# Patient Record
Sex: Male | Born: 1985 | Race: White | Hispanic: No | Marital: Married | Smoking: Never smoker
Health system: Southern US, Community
[De-identification: ages and names within clinical notes are randomized; demographics above are authoritative.]

## PROBLEM LIST (undated history)

## (undated) DIAGNOSIS — G43909 Migraine, unspecified, not intractable, without status migrainosus: Secondary | ICD-10-CM

## (undated) DIAGNOSIS — Z8701 Personal history of pneumonia (recurrent): Secondary | ICD-10-CM

## (undated) DIAGNOSIS — T7840XA Allergy, unspecified, initial encounter: Secondary | ICD-10-CM

## (undated) DIAGNOSIS — R161 Splenomegaly, not elsewhere classified: Secondary | ICD-10-CM

## (undated) DIAGNOSIS — R55 Syncope and collapse: Secondary | ICD-10-CM

## (undated) DIAGNOSIS — K760 Fatty (change of) liver, not elsewhere classified: Secondary | ICD-10-CM

## (undated) HISTORY — DX: Syncope and collapse: R55

## (undated) HISTORY — DX: Personal history of pneumonia (recurrent): Z87.01

## (undated) HISTORY — DX: Fatty (change of) liver, not elsewhere classified: K76.0

## (undated) HISTORY — DX: Splenomegaly, not elsewhere classified: R16.1

## (undated) HISTORY — DX: Migraine, unspecified, not intractable, without status migrainosus: G43.909

## (undated) HISTORY — DX: Allergy, unspecified, initial encounter: T78.40XA

---
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
## 2009-10-21 ENCOUNTER — Emergency Department (HOSPITAL_BASED_OUTPATIENT_CLINIC_OR_DEPARTMENT_OTHER): Admission: EM | Admit: 2009-10-21 | Discharge: 2009-10-21 | Payer: Self-pay | Admitting: Emergency Medicine

## 2010-09-05 LAB — BASIC METABOLIC PANEL
BUN: 19 mg/dL (ref 6–23)
CO2: 29 mEq/L (ref 19–32)
Calcium: 8.8 mg/dL (ref 8.4–10.5)
Chloride: 108 mEq/L (ref 96–112)
Creatinine, Ser: 1 mg/dL (ref 0.4–1.5)
GFR calc Af Amer: >60 mL/min (ref >60)
GFR calc non Af Amer: >60 mL/min (ref >60)
Glucose, Bld: 117 mg/dL — ABNORMAL HIGH (ref 70–99)
Potassium: 4 mEq/L (ref 3.5–5.1)
Sodium: 145 mEq/L (ref 135–145)

## 2010-09-05 LAB — CBC
HCT: 44.5 % (ref 39.0–52.0)
Hemoglobin: 15 g/dL (ref 13.0–17.0)
MCHC: 33.8 g/dL (ref 30.0–36.0)
RBC: 5.21 MIL/uL (ref 4.22–5.81)
RDW: 13.3 % (ref 11.5–15.5)

## 2010-09-05 LAB — DIFFERENTIAL
Basophils Absolute: 0.1 10*3/uL (ref 0.0–0.1)
Eosinophils Relative: 7 % — ABNORMAL HIGH (ref 0–5)
Lymphocytes Relative: 30 % (ref 12–46)
Monocytes Absolute: 0.3 10*3/uL (ref 0.1–1.0)
Monocytes Relative: 4 % (ref 3–12)

## 2010-09-05 LAB — POCT CARDIAC MARKERS: Troponin i, poc: <0.05 ng/mL (ref 0.00–0.09)

## 2011-03-10 ENCOUNTER — Encounter: Payer: Self-pay | Admitting: Emergency Medicine

## 2011-03-10 ENCOUNTER — Emergency Department (HOSPITAL_BASED_OUTPATIENT_CLINIC_OR_DEPARTMENT_OTHER)
Admission: EM | Admit: 2011-03-10 | Discharge: 2011-03-10 | Disposition: A | Discharge status: Home / Self Care | Payer: Worker's Compensation | Attending: Emergency Medicine | Admitting: Emergency Medicine

## 2011-03-10 ENCOUNTER — Emergency Department (INDEPENDENT_AMBULATORY_CARE_PROVIDER_SITE_OTHER): Payer: Worker's Compensation

## 2011-03-10 DIAGNOSIS — S97109A Crushing injury of unspecified toe(s), initial encounter: Secondary | ICD-10-CM

## 2011-03-10 DIAGNOSIS — S99929A Unspecified injury of unspecified foot, initial encounter: Secondary | ICD-10-CM

## 2011-03-10 DIAGNOSIS — IMO0002 Reserved for concepts with insufficient information to code with codable children: Secondary | ICD-10-CM | POA: Insufficient documentation

## 2011-03-10 DIAGNOSIS — X58XXXA Exposure to other specified factors, initial encounter: Secondary | ICD-10-CM

## 2011-03-10 DIAGNOSIS — Y9289 Other specified places as the place of occurrence of the external cause: Secondary | ICD-10-CM | POA: Insufficient documentation

## 2011-03-10 DIAGNOSIS — S8990XA Unspecified injury of unspecified lower leg, initial encounter: Secondary | ICD-10-CM | POA: Insufficient documentation

## 2011-03-10 MED ORDER — NAPROXEN 500 MG PO TABS: 500.0000 mg | ORAL_TABLET | Freq: Two times a day (BID) | ORAL | Status: DC

## 2011-03-10 NOTE — ED Provider Notes (Signed)
History     CSN: [id number] Arrival date & time: 03/10/2011 10:52 AM  Chief Complaint  Patient presents with  . Toe Injury    HPI  (Consider location/radiation/quality/duration/timing/severity/associated sxs/prior treatment)  The history is provided by the patient.  HAD CART JACK RUN OVER LEFT TOE YESTERDAY AT WORK SOME BLEEDING FROM NAIL AREA BUT RESOLVED. NO OTHER INJURY.   History reviewed. No pertinent past medical history.  History reviewed. No pertinent past surgical history.  History reviewed. No pertinent family history.  History  Substance Use Topics  . Smoking status: Never Smoker   . Smokeless tobacco: Not on file  . Alcohol Use: Yes     occastional      Review of Systems  Review of Systems  Constitutional: Negative for fever.  HENT: Negative for neck pain.   Respiratory: Negative for shortness of breath.   Cardiovascular: Negative for chest pain.  Gastrointestinal: Negative for abdominal pain.  Musculoskeletal: Negative for back pain.  Skin: Positive for wound. Negative for rash.  Neurological: Negative for headaches.    Allergies  Review of patient's allergies indicates no known allergies.  Home Medications   Current Outpatient Rx  Name Route Sig Dispense Refill  . NAPROXEN 500 MG PO TABS Oral Take 1 tablet (500 mg total) by mouth 2 (two) times daily. 30 tablet 0    Physical Exam    BP 135/75  Pulse 63  Temp(Src) 98.2 F (36.8 C) (Oral)  Resp 16  Ht 6\' 4"  (1.93 m)  Wt 240 lb (108.863 kg)  BMI 29.21 kg/m2  SpO2 100%  Physical Exam  Nursing note and vitals reviewed. Constitutional: He is oriented to person, place, and time. He appears well-developed and well-nourished. No distress.  HENT:  Head: Normocephalic and atraumatic.  Mouth/Throat: Oropharynx is clear and moist.  Eyes: Pupils are equal, round, and reactive to light.  Neck: Normal range of motion. Neck supple.  Cardiovascular: Normal rate, regular rhythm and normal heart  sounds.   Pulmonary/Chest: Effort normal and breath sounds normal.  Abdominal: Soft. Bowel sounds are normal.  Musculoskeletal: Normal range of motion. He exhibits tenderness.       NL EX FOR LEFT TOE WITH CONTUSION AND SOME SUB UNGAL HEMATOMA. NO ACITVE BLEEDING   Neurological: He is alert and oriented to person, place, and time. No cranial nerve deficit. He exhibits normal muscle tone. Coordination normal.  Skin: Skin is warm. No rash noted.    ED Course  Procedures (including critical care time)  Labs Reviewed - No data to display Dg Toe Great Left  03/10/2011  *RADIOLOGY REPORT*  Clinical Data: Crash injury yesterday.  Bruising and swelling.  LEFT TOE - 2+ VIEW  Comparison: None.  Findings: No acute fracture or dislocation.  Mild soft tissue swelling.  IMPRESSION: Soft tissue swelling, without acute osseous abnormality.  Original Report Authenticated By: Consuello Bossier, M.D.     1. Toe injury      MDM NO FRACTURE   IMP TOE INJURY.         Shelda Jakes, MD 03/10/11 1254

## 2011-03-10 NOTE — ED Notes (Signed)
Left big toe injury occurred at work.  Pt ran over with pallet jack.  Noted bruising and swelling to left toe.

## 2011-06-19 DIAGNOSIS — R161 Splenomegaly, not elsewhere classified: Secondary | ICD-10-CM

## 2011-06-19 DIAGNOSIS — K760 Fatty (change of) liver, not elsewhere classified: Secondary | ICD-10-CM

## 2011-06-19 HISTORY — DX: Splenomegaly, not elsewhere classified: R16.1

## 2011-06-19 HISTORY — DX: Fatty (change of) liver, not elsewhere classified: K76.0

## 2011-12-05 ENCOUNTER — Other Ambulatory Visit: Payer: Self-pay | Admitting: Internal Medicine

## 2011-12-10 ENCOUNTER — Other Ambulatory Visit: Payer: Self-pay

## 2011-12-11 ENCOUNTER — Ambulatory Visit
Admission: RE | Admit: 2011-12-11 | Discharge: 2011-12-11 | Disposition: A | Discharge status: Home / Self Care | Payer: Managed Care, Other (non HMO) | Source: Ambulatory Visit | Attending: Internal Medicine | Admitting: Internal Medicine

## 2011-12-13 ENCOUNTER — Encounter: Payer: Self-pay | Admitting: Internal Medicine

## 2012-01-04 ENCOUNTER — Encounter: Payer: Self-pay | Admitting: *Deleted

## 2012-01-07 ENCOUNTER — Encounter: Payer: Self-pay | Admitting: Internal Medicine

## 2012-01-07 ENCOUNTER — Other Ambulatory Visit (INDEPENDENT_AMBULATORY_CARE_PROVIDER_SITE_OTHER): Payer: Managed Care, Other (non HMO)

## 2012-01-07 ENCOUNTER — Ambulatory Visit (INDEPENDENT_AMBULATORY_CARE_PROVIDER_SITE_OTHER): Payer: Managed Care, Other (non HMO) | Admitting: Internal Medicine

## 2012-01-07 VITALS — BP 102/66 | HR 72 | Ht 76.0 in | Wt 245.2 lb

## 2012-01-07 DIAGNOSIS — R748 Abnormal levels of other serum enzymes: Secondary | ICD-10-CM

## 2012-01-07 LAB — HEPATIC FUNCTION PANEL
Alkaline Phosphatase: 58 U/L (ref 39–117)
Bilirubin, Direct: 0.1 mg/dL (ref 0.0–0.3)
Total Bilirubin: 0.8 mg/dL (ref 0.3–1.2)
Total Protein: 6.9 g/dL (ref 6.0–8.3)

## 2012-01-07 NOTE — Patient Instructions (Addendum)
Please go to the basement for blood tests (CPK, hepatic function panel) that will be used to try to understand the abnormal blood tests (transaminases). We will call the results and plans. I have also given you information about fatty liver, which this might be due to.  Thank you for choosing me and  Gastroenterology.  Iva Boop, MD, [facility]

## 2012-01-07 NOTE — Progress Notes (Signed)
Quick Note:  LFT's and CPK normal  Abnormalities could have been from muscles  Recheck LFT's in 3 months  Ok to exercise if desired - no more supplements ______

## 2012-01-07 NOTE — Progress Notes (Signed)
Subjective:    Patient ID: Mitchell Wilkinson, male    DOB: [birth year], 26 y.o.   MRN: [id number] Referred by Quentin Mulling, PA-C HPI This 26 yo wm presents for evaluation of abnormal transaminases. He was found to have transaminases about 2x abnormal on recent physical labs. No prior history of liver test abnormalities. He had started exercising with free weights and at the gym at that time He was also using a "testosterone booster". He thinks he had some muscle aches but is not sure. No history of blood transfusions, family history of liver disease or alcohol use. GI ROS is otherwise negative.   No Known Allergies Outpatient Prescriptions Prior to Visit  Medication Sig Dispense Refill  . naproxen (NAPROSYN) 500 MG tablet Take 1 tablet (500 mg total) by mouth 2 (two) times daily.  30 tablet  0   Past Medical History  Diagnosis Date  . Splenomegaly 2013  . Fatty liver 2013  . Syncope     stress induced  . Migraines   . History of pneumonia    History reviewed. No pertinent past surgical history. History   Social History  . Marital Status: Married    Spouse Name: N/A    Number of Children: 0  .     Occupational History  .  [employer]   Social History Main Topics  . Smoking status: Never Smoker   . Smokeless tobacco: Never Used  . Alcohol Use: Yes     ocass  . Drug Use: No    Family History  Problem Relation Age of Onset  . Hypertension Father   . Diabetes Mother   . Colon polyps Mother   . Diabetes Maternal Grandfather   . Diabetes Maternal Grandmother   . Heart disease Maternal Grandfather   . Heart disease Paternal Grandfather     Review of Systems + allergies and sinus problems, back pain, headaches All other ROS negative or as per HPI    Objective:   Physical Exam     Physical Exam: General:  Well-developed, well-nourished and in no acute distress Eyes:  anicteric. ENT:   Mouth and posterior pharynx free of lesions.  Neck:   supple w/o thyromegaly or  mass.  Lungs: Clear to auscultation bilaterally. Heart:  S1S2, no rubs, murmurs, gallops. Abdomen:  soft, non-tender, no hepatosplenomegaly, hernia, or mass and BS+.  Lymph:  no cervical or supraclavicular adenopathy. Extremities:   no edema Skin   no rash. Neuro:  A&O x 3.  Psych:  appropriate mood and  Affect.   Data Reviewed: US Abdomen - 12/11/2011 mild fatty infiltration of liver and mild splenomegaly 11/21/10 AST 39 and ALT 39 11/28/2011 AST 80 and ALT 92 with otherwise normal LFT's and CBC LDL 116 but lipids o/w normal TSH normal Hep A IgM antibody negative Hep B Surace Ag and Hep B core Ab IgM negative HCV antibody negative  Ceruloplasmin 25 Anti-smooth mm Ab negative and AMA negative      Assessment & Plan:   1. Abnormal transaminases  CK (Creatine Kinase), Hepatic function panel   Cause not clear but could be from skeletal  muscle given hx of exercsing and testosterone booster. Check LFT's and CPK. If ok then repeat LFT's in 3 months.  If transaminases still elevated and CPK normal, probably is a fatty liver issue. Check ferritin and ANA and alpha-1 antitrypsin labs to be sure, if this pattern of tests abnormality appears.  I appreciate the opportunity to care for this patient.  CC: Quentin Mulling, PA-C  [place]:  Lab Results  Component Value Date   CKTOTAL 117 01/07/2012   Lab Results  Component Value Date   ALT 39 01/07/2012   AST 25 01/07/2012   ALKPHOS 58 01/07/2012   BILITOT 0.8 01/07/2012   All normal. Will notify patient and recheck LFT's in 3 months.

## 2012-01-08 ENCOUNTER — Other Ambulatory Visit: Payer: Self-pay | Admitting: *Deleted

## 2012-01-08 DIAGNOSIS — R945 Abnormal results of liver function studies: Secondary | ICD-10-CM

## 2012-04-03 ENCOUNTER — Telehealth: Payer: Self-pay

## 2012-04-03 NOTE — Telephone Encounter (Signed)
Spoke with the patient and he is advised he is due for repeat LFTs.  He will come one day this week or next

## 2013-04-06 IMAGING — CR DG TOE GREAT 2+V*L*
3 series · 3 of 3 positions shown · non-contrast
Comparison: None.

CLINICAL DATA: Crash injury yesterday.  Bruising and swelling.

LEFT TOE - 2+ VIEW

[t toes ap left]
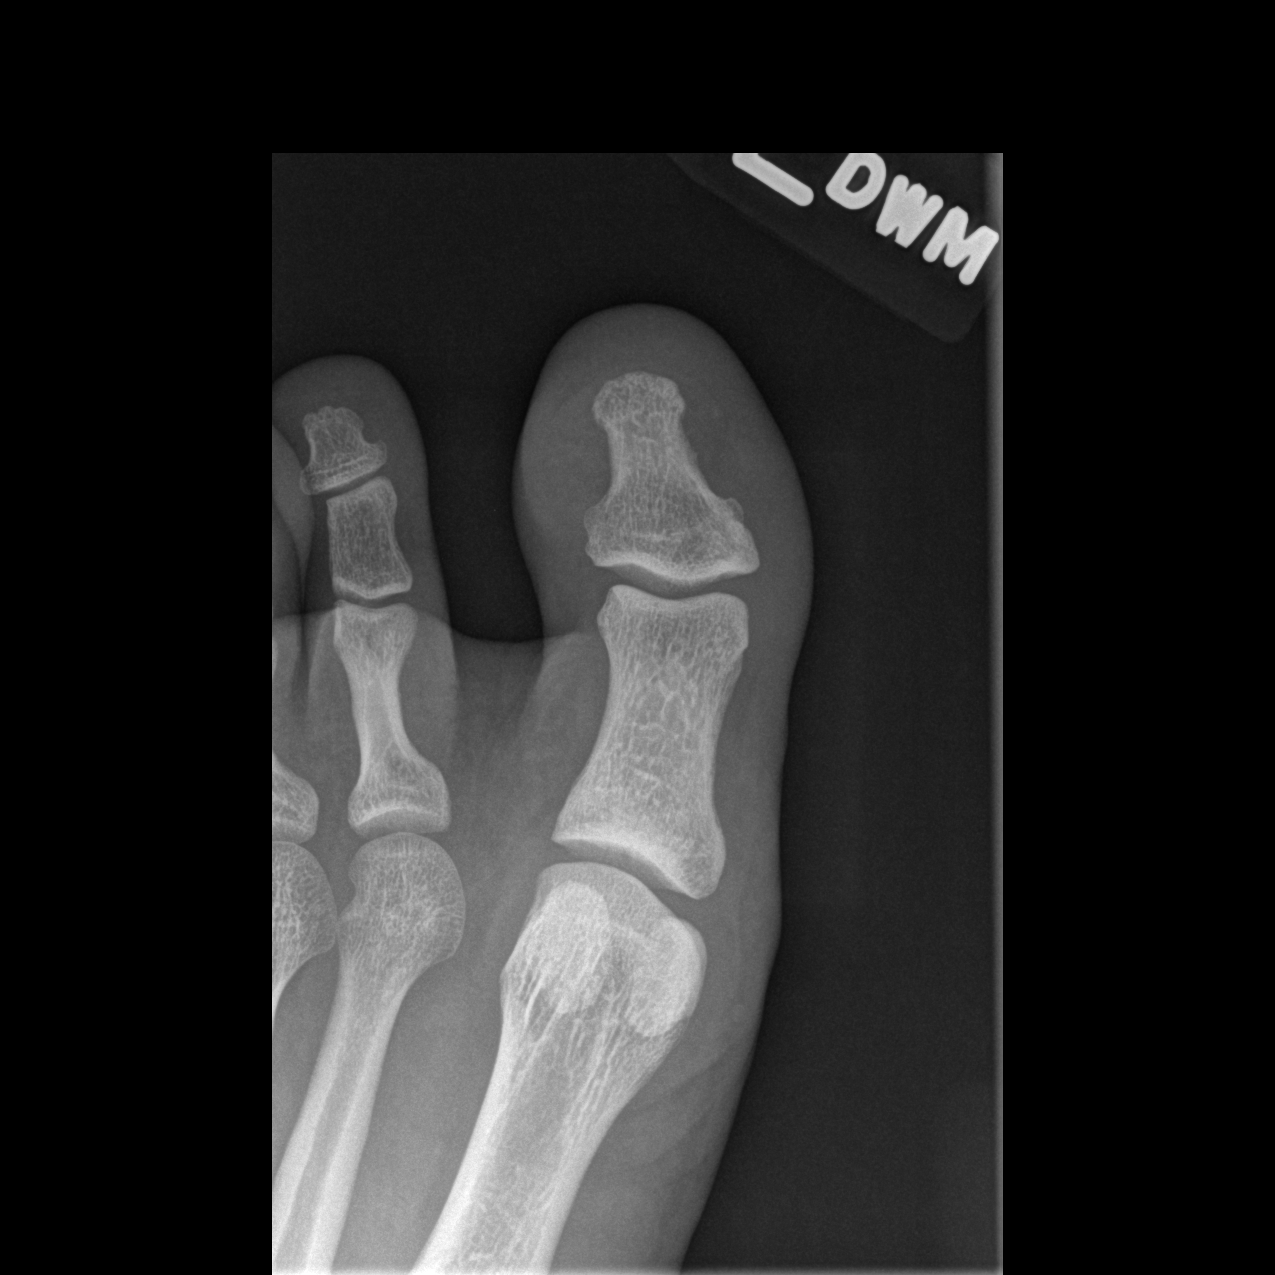

[t toes oblique left]
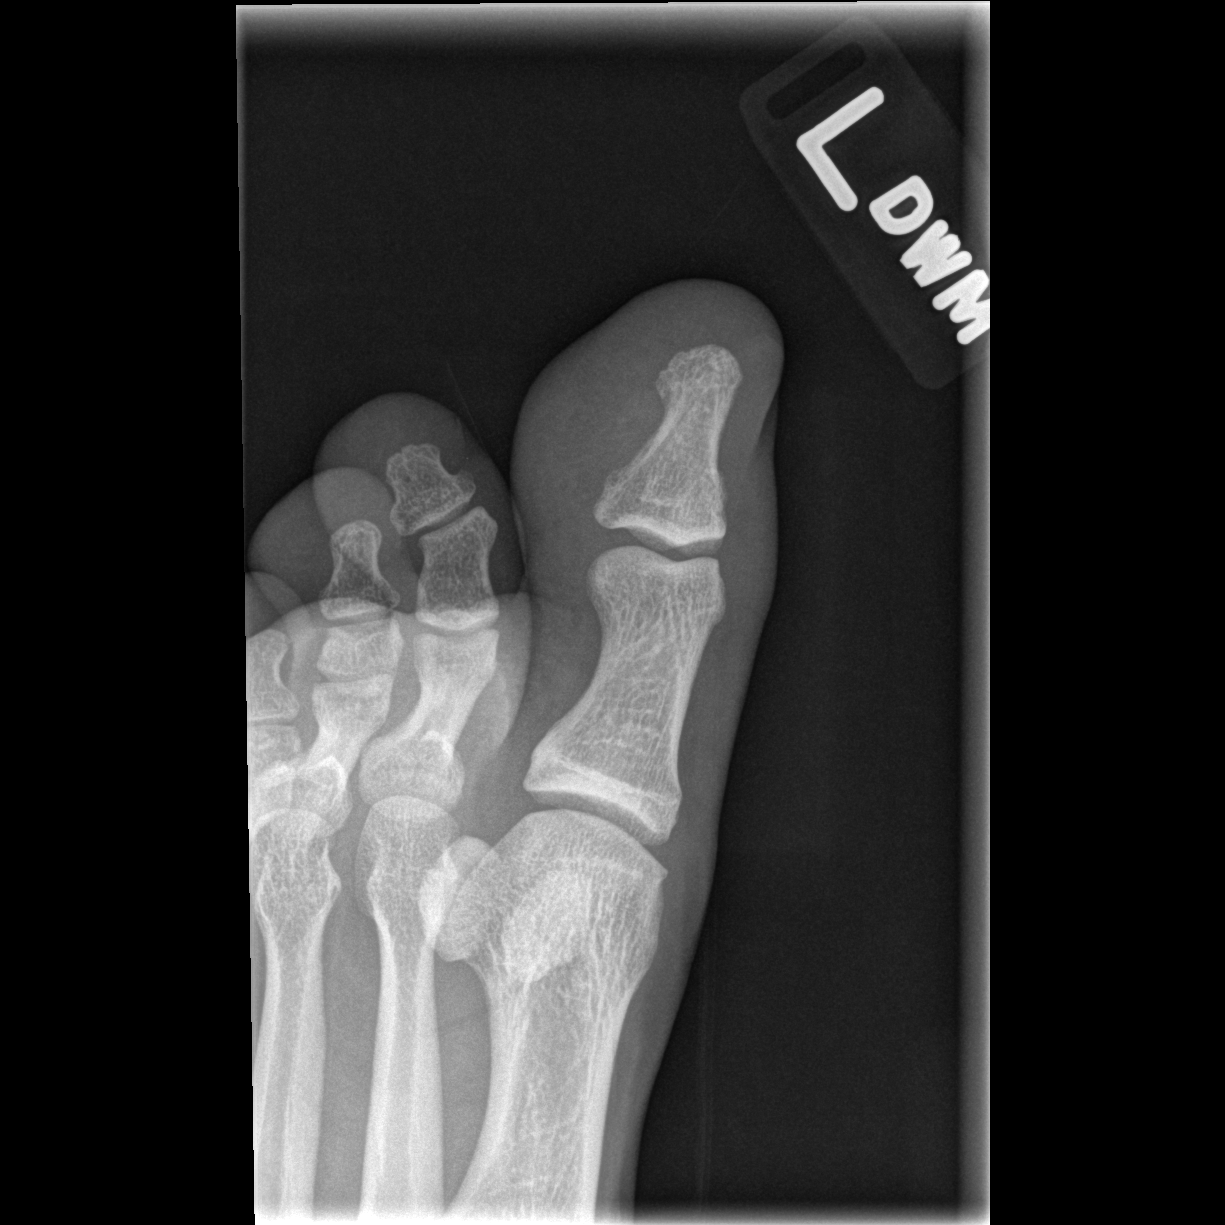

[t toes lateral left]
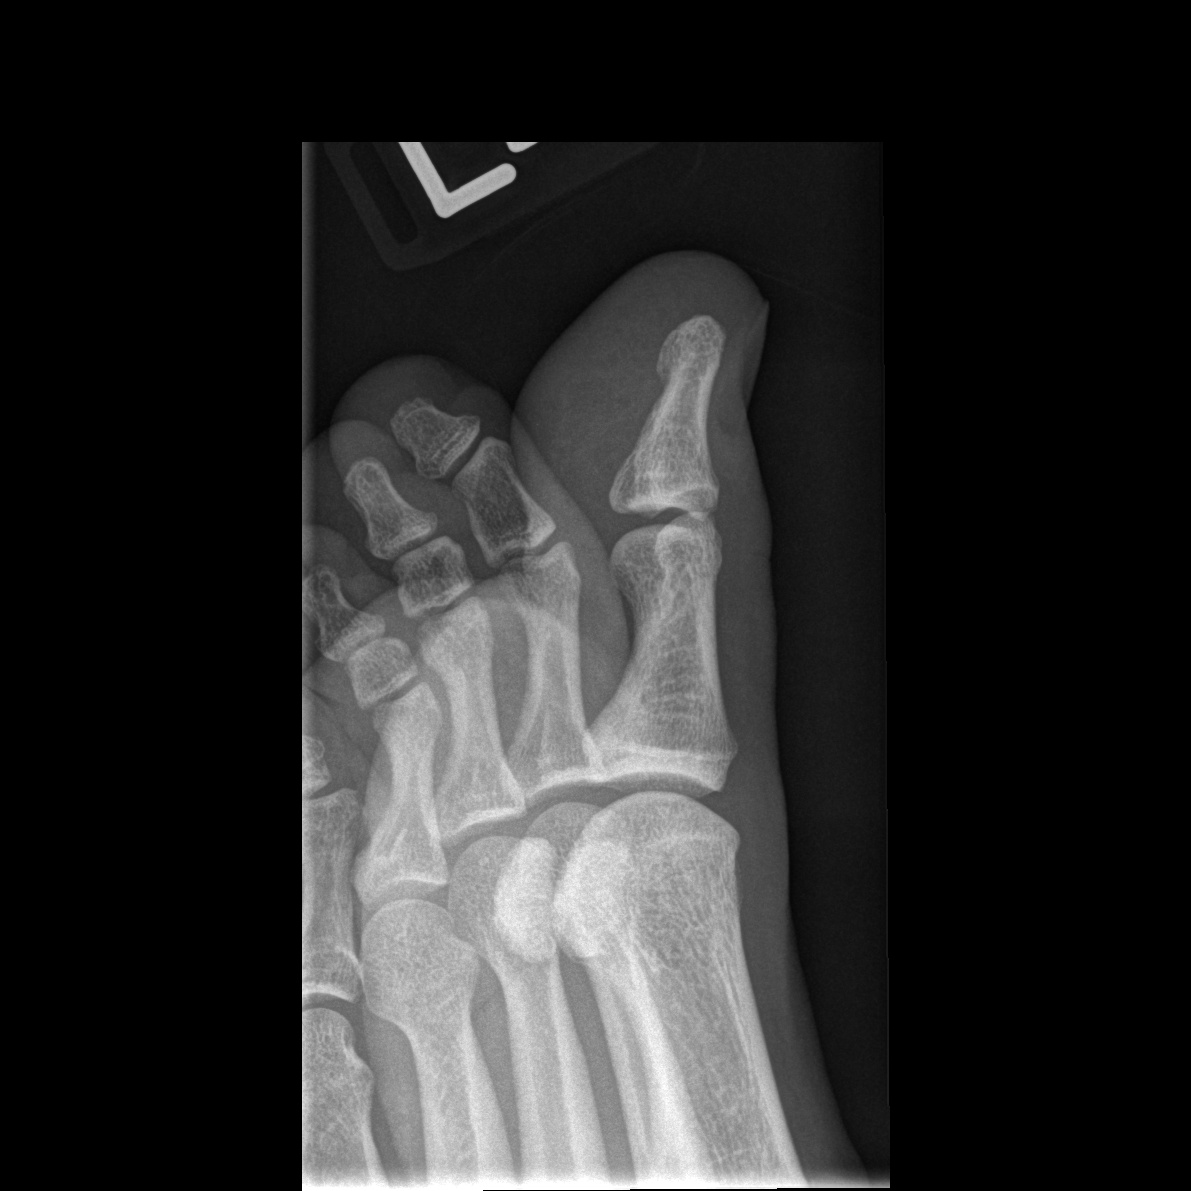

[3 of 3 positions shown; findings below may reference images not displayed]

FINDINGS: No acute fracture or dislocation.  Mild soft tissue
swelling.
IMPRESSION: Soft tissue swelling, without acute osseous abnormality.

## 2013-12-01 ENCOUNTER — Encounter: Payer: Self-pay | Admitting: Physician Assistant

## 2013-12-15 DIAGNOSIS — Z8701 Personal history of pneumonia (recurrent): Secondary | ICD-10-CM | POA: Insufficient documentation

## 2013-12-15 DIAGNOSIS — R55 Syncope and collapse: Secondary | ICD-10-CM | POA: Insufficient documentation

## 2013-12-15 DIAGNOSIS — G43909 Migraine, unspecified, not intractable, without status migrainosus: Secondary | ICD-10-CM | POA: Insufficient documentation

## 2013-12-15 DIAGNOSIS — K760 Fatty (change of) liver, not elsewhere classified: Secondary | ICD-10-CM | POA: Insufficient documentation

## 2013-12-15 DIAGNOSIS — R161 Splenomegaly, not elsewhere classified: Secondary | ICD-10-CM | POA: Insufficient documentation

## 2013-12-15 DIAGNOSIS — T7840XA Allergy, unspecified, initial encounter: Secondary | ICD-10-CM | POA: Insufficient documentation

## 2013-12-16 ENCOUNTER — Encounter: Payer: Self-pay | Admitting: Physician Assistant

## 2013-12-16 ENCOUNTER — Ambulatory Visit (INDEPENDENT_AMBULATORY_CARE_PROVIDER_SITE_OTHER): Payer: 59 | Admitting: Physician Assistant

## 2013-12-16 VITALS — BP 138/88 | HR 72 | Resp 16 | Ht 76.0 in | Wt 258.0 lb

## 2013-12-16 DIAGNOSIS — Z0000 Encounter for general adult medical examination without abnormal findings: Secondary | ICD-10-CM

## 2013-12-16 DIAGNOSIS — M5430 Sciatica, unspecified side: Secondary | ICD-10-CM

## 2013-12-16 DIAGNOSIS — M5442 Lumbago with sciatica, left side: Secondary | ICD-10-CM

## 2013-12-16 DIAGNOSIS — H6120 Impacted cerumen, unspecified ear: Secondary | ICD-10-CM

## 2013-12-16 DIAGNOSIS — H6123 Impacted cerumen, bilateral: Secondary | ICD-10-CM

## 2013-12-16 LAB — CBC WITH DIFFERENTIAL/PLATELET
BASOS ABS: 0 10*3/uL (ref 0.0–0.1)
Basophils Relative: 0 % (ref 0–1)
Eosinophils Absolute: 0.4 10*3/uL (ref 0.0–0.7)
Eosinophils Relative: 6 % — ABNORMAL HIGH (ref 0–5)
HCT: 44.5 % (ref 39.0–52.0)
Hemoglobin: 15.6 g/dL (ref 13.0–17.0)
LYMPHS PCT: 28 % (ref 12–46)
Lymphs Abs: 1.9 10*3/uL (ref 0.7–4.0)
MCH: 28.7 pg (ref 26.0–34.0)
MCHC: 35.1 g/dL (ref 30.0–36.0)
MCV: 82 fL (ref 78.0–100.0)
Monocytes Absolute: 0.3 10*3/uL (ref 0.1–1.0)
Monocytes Relative: 5 % (ref 3–12)
NEUTROS ABS: 4.2 10*3/uL (ref 1.7–7.7)
Neutrophils Relative %: 61 % (ref 43–77)
PLATELETS: 296 10*3/uL (ref 150–400)
RBC: 5.43 MIL/uL (ref 4.22–5.81)
RDW: 14 % (ref 11.5–15.5)
WBC: 6.9 10*3/uL (ref 4.0–10.5)

## 2013-12-16 LAB — HEMOGLOBIN A1C
Hgb A1c MFr Bld: 5.4 % (ref <5.7)
Mean Plasma Glucose: 108 mg/dL (ref <117)

## 2013-12-16 MED ORDER — CELECOXIB 200 MG PO CAPS: 200.0000 mg | ORAL_CAPSULE | Freq: Two times a day (BID) | ORAL | Status: AC

## 2013-12-16 MED ORDER — CYCLOBENZAPRINE HCL 10 MG PO TABS: 10.0000 mg | ORAL_TABLET | Freq: Three times a day (TID) | ORAL | Status: AC | PRN

## 2013-12-16 NOTE — Progress Notes (Signed)
Complete Physical  Assessment and Plan: Splenomegaly- monitor CBC/labs  Fatty liver with history of elevated LFTs with normal through work up with Dr. Clearnce SorrelGessner  Syncope-stress induced, remote history  Migraines- controlled  Obesity, BMI 31- long discussion about weight loss, diet, and exercise  Allergy- continue OTC med  LBP- RICE, stretches, Celebrex, flexeril.  Bilateral cerumen impaction- removed manually, will do oil at home and come in if needed  Discussed med's effects and SE's. Screening labs and tests as requested with regular follow-up as recommended.  HPI 28 y.o. male  presents for a complete physical. His blood pressure has been controlled at home, today their BP is BP: 138/88 mmHg He does workout. He denies chest pain, shortness of breath, dizziness.  He is not on cholesterol medication and denies myalgias. His cholesterol is at goal. The cholesterol last visit was: LDL 122 Last A1C in the office was: 5.2 Patient is on Vitamin D supplement, 40.  Married 5 years, new house, and wife is pregnant with baby girl, Claris GowerCharlotte due in Sept.  For the past week he has been having lower left back pain with mild radiation around to his hip, worse after doing a lot, has been icing it. Has a history of back pain, last time injured it at work and states saw a doctor 1-2 years ago. Takes 2 aleve occ for it which helps.   Current Medications:  No current outpatient prescriptions on file prior to visit.   No current facility-administered medications on file prior to visit.   Health Maintenance:  Immunization History  Administered Date(s) Administered  . Tdap 11/27/2012   Tetanus: 2014 Pneumovax: Flu vaccine: Zostavax: DEXA: Colonoscopy: EGD:  Allergies: No Known Allergies Medical History:  Past Medical History  Diagnosis Date  . Splenomegaly 2013  . Fatty liver 2013  . Syncope     stress induced  . Migraines   . History of pneumonia   . Allergy    Surgical History: No  past surgical history on file. Family History:  Family History  Problem Relation Age of Onset  . Hypertension Father   . Diabetes Mother   . Colon polyps Mother   . Diabetes Maternal Grandfather   . Diabetes Maternal Grandmother   . Heart disease Maternal Grandfather   . Heart disease Paternal Grandfather    Social History:   History  Substance Use Topics  . Smoking status: Never Smoker   . Smokeless tobacco: Never Used  . Alcohol Use: Yes     Comment: ocass   ROS:  [X]  = complains of  [ ]  = denies  General: Fatigue [ ]  Fever [ ]  Chills [ ]  Weakness [ ]   Insomnia [ ]  Weight change [ ]  Night sweats [ ]   Change in appetite [ ]  Eyes: Redness [ ]  Blurred vision [ ]  Diplopia [ ]  Discharge [ ]   ENT: Congestion [ ]  Sinus Pain [ ]  Post Nasal Drip [ ]  Sore Throat [ ]  Earache [ ]  hearing loss [ ]  Tinnitus [ ]  Snoring [ ]   Cardiac: Chest pain/pressure [ ]  SOB [ ]  Orthopnea [ ]   Palpitations [ ]   Paroxysmal nocturnal dyspnea[ ]  Claudication [ ]  Edema [ ]   Pulmonary: Cough [ ]  Wheezing[ ]   SOB [ ]   Pleurisy [ ]   GI: Nausea [ ]  Vomiting[ ]  Dysphagia[ ]  Heartburn[ ]  Abdominal pain [ ]  Constipation [ ] ; Diarrhea [ ]  BRBPR [ ]  Melena[ ]  Bloating [ ]  Hemorrhoids [ ]   GU: Hematuria[ ]   Dysuria [ ]  Nocturia[ ]  Urgency [ ]   Hesitancy [ ]  Discharge [ ]  Frequency [ ]   Neuro: Headaches[ ]  Vertigo[ ]  Paresthesias[ ]  Spasm [ ]  Speech changes [ ]  Incoordination [ ]   Ortho: Arthritis [ ]  Joint pain [ ]  Muscle pain [ ]  Joint swelling [ ]  Back Pain [x ] Skin:  Rash [ ]   Pruritis [ ]  Change in skin lesion [ ]   Psych: Depression[ ]  Anxiety[ ]  Confusion [ ]  Memory loss [ ]   Heme/Lypmh: Bleeding [ ]  Bruising [ ]  Enlarged lymph nodes [ ]   Endocrine: Visual blurring [ ]  Paresthesia [ ]  Polyuria [ ]  Polydypsea [ ]    Heat/cold intolerance [ ]  Hypoglycemia [ ]   Physical Exam: Estimated body mass index is 31.42 kg/(m^2) as calculated from the following:   Height as of this encounter: 6\' 4"  (1.93 m).   Weight as of  this encounter: 258 lb (117.028 kg). BP 138/88  Pulse 72  Resp 16  Ht 6\' 4"  (1.93 m)  Wt 258 lb (117.028 kg)  BMI 31.42 kg/m2 General Appearance: Well nourished, in no apparent distress. Eyes: PERRLA, EOMs, conjunctiva no swelling or erythema, normal fundi and vessels. Sinuses: No Frontal/maxillary tenderness ENT/Mouth: Cerumen impaction impaction Good dentition. No erythema, swelling, or exudate on post pharynx. Tonsils not swollen or erythematous. Hearing normal.  Neck: Supple, thyroid normal. No bruits Respiratory: Respiratory effort normal, BS equal bilaterally without rales, rhonchi, wheezing or stridor. Cardio: RRR without murmurs, rubs or gallops. Brisk peripheral pulses without edema.  Chest: symmetric, with normal excursions and percussion. Abdomen: Soft, +BS. Non tender, no guarding, rebound, hernias, masses, or organomegaly. .  Lymphatics: Non tender without lymphadenopathy.  Genitourinary: defer Musculoskeletal: Full ROM all peripheral extremities,5/5 strength, and normal gait. Patient is able to ambulate well.  Gait is not  antalgic. Straight leg raising negative bilaterally for radicular symptoms. Sensory exam in the legs is normal.  Knee reflexes are normal Ankle reflexes are normal. Strength is normal and symmetric.There isparaspinal muscle spasm.  There is not midline tenderness.  ROM of spine with normal flexion, extension, lateral range of motion to the right and left, and rotation to the right and left.  Skin: Warm, dry without rashes, lesions, ecchymosis. 3x4 left lower back, 3x3 left upper back both unchanged.  Neuro: Cranial nerves intact, reflexes equal bilaterally. Normal muscle tone, no cerebellar symptoms. Sensation intact.  Psych: Awake and oriented X 3, normal affect, Insight and Judgment appropriate.   EKG: WNL no changes, PVC's.   Quentin Mullingollier, Jemel Ono 2:12 PM

## 2013-12-16 NOTE — Patient Instructions (Addendum)
Stevia  We want weight loss that will last so you should lose 1-2 pounds a week.  THAT IS IT! Please pick THREE things a month to change. Once it is a habit check off the item. Then pick another three items off the list to become habits.  If you are already doing a habit on the list GREAT!  Cross that item off! o Don't drink your calories. Ie, alcohol, soda, fruit juice, and sweet tea.  o Drink more water. Drink a glass when you feel hungry or before each meal. 64 oz a day AT LEAST o Eat breakfast - Complex carb and protein (likeDannon light and fit yogurt, oatmeal, fruit, eggs, [place]key bacon). o Measure your cereal.  Eat no more than one cup a day. (ie [place]) o Eat an apple a day. o Add a vegetable a day. o Try a new vegetable a month. o Use Pam! Stop using oil or butter to cook. o Don't finish your plate or use smaller plates. o Share your dessert. o Eat sugar free Jello for dessert or frozen grapes. o Don't eat 2-3 hours before bed. o Switch to whole wheat bread, pasta, and brown rice. o Make healthier choices when you eat out. No fries! o Pick baked chicken, NOT fried. o Don't forget to SLOW DOWN when you eat. It is not going anywhere.  o Take the stairs. o Park far away in the parking lot o State Farm[employer]ift soup cans (or weights) for 10 minutes while watching TV. o Walk at work for 10 minutes during break. o Walk outside 1 time a week with your friend, kids, dog, or significant other. o Start a walking group at church. o Walk the mall as much as you can tolerate.  o Keep a food diary. o Weigh yourself daily. o Walk for 15 minutes 3 days per week. o Cook at home more often and eat out less.  If life happens and you go back to old habits, it is okay.  Just start over. You can do it!   If you experience chest pain, get short of breath, or tired during the exercise, please stop immediately and inform your doctor.   Use a dropper to put olive oil or canola oil in the effected ear- 2-3 times a  week. Let it soak for 20-30 min then you can take a shower or use a baby bulb with warm water to wash out the ear wax.  Do not use Qtips  Back Exercises Back exercises help treat and prevent back injuries. The goal of back exercises is to increase the strength of your abdominal and back muscles and the flexibility of your back. These exercises should be started when you no longer have back pain. Back exercises include:  Pelvic Tilt. Lie on your back with your knees bent. Tilt your pelvis until the lower part of your back is against the floor. Hold this position 5 to 10 sec and repeat 5 to 10 times.  Knee to Chest. Pull first 1 knee up against your chest and hold for 20 to 30 seconds, repeat this with the other knee, and then both knees. This may be done with the other leg straight or bent, whichever feels better.  Sit-Ups or Curl-Ups. Bend your knees 90 degrees. Start with tilting your pelvis, and do a partial, slow sit-up, lifting your trunk only 30 to 45 degrees off the floor. Take at least 2 to 3 seconds for each sit-up. Do not do sit-ups with  your knees out straight. If partial sit-ups are difficult, simply do the above but with only tightening your abdominal muscles and holding it as directed.  Hip-Lift. Lie on your back with your knees flexed 90 degrees. Push down with your feet and shoulders as you raise your hips a couple inches off the floor; hold for 10 seconds, repeat 5 to 10 times.  Back arches. Lie on your stomach, propping yourself up on bent elbows. Slowly press on your hands, causing an arch in your low back. Repeat 3 to 5 times. Any initial stiffness and discomfort should lessen with repetition over time.  Shoulder-Lifts. Lie face down with arms beside your body. Keep hips and torso pressed to floor as you slowly lift your head and shoulders off the floor. Do not overdo your exercises, especially in the beginning. Exercises may cause you some mild back discomfort which lasts for a  few minutes; however, if the pain is more severe, or lasts for more than 15 minutes, do not continue exercises until you see your caregiver. Improvement with exercise therapy for back problems is slow.  See your caregivers for assistance with developing a proper back exercise program. Document Released: 07/12/2004 Document Revised: 08/27/2011 Document Reviewed: 04/05/2011 [employer] Patient Information 2015 [employer]. This information is not intended to replace advice given to you by your health care provider. Make sure you discuss any questions you have with your health care provider.

## 2013-12-17 LAB — URINALYSIS, ROUTINE W REFLEX MICROSCOPIC
Bilirubin Urine: NEGATIVE
Glucose, UA: NEGATIVE mg/dL
Hgb urine dipstick: NEGATIVE
KETONES UR: NEGATIVE mg/dL
LEUKOCYTES UA: NEGATIVE
NITRITE: NEGATIVE
PH: 5.5 (ref 5.0–8.0)
Protein, ur: NEGATIVE mg/dL
SPECIFIC GRAVITY, URINE: 1.02 (ref 1.005–1.030)
Urobilinogen, UA: 0.2 mg/dL (ref 0.0–1.0)

## 2013-12-17 LAB — VITAMIN D 25 HYDROXY (VIT D DEFICIENCY, FRACTURES): VIT D 25 HYDROXY: 44 ng/mL (ref 30–89)

## 2013-12-17 LAB — BASIC METABOLIC PANEL WITH GFR
BUN: 16 mg/dL (ref 6–23)
CHLORIDE: 102 meq/L (ref 96–112)
CO2: 28 mEq/L (ref 19–32)
CREATININE: 0.89 mg/dL (ref 0.50–1.35)
Calcium: 9.5 mg/dL (ref 8.4–10.5)
GFR, Est African American: >89 mL/min
Glucose, Bld: 87 mg/dL (ref 70–99)
POTASSIUM: 4.4 meq/L (ref 3.5–5.3)
SODIUM: 138 meq/L (ref 135–145)

## 2013-12-17 LAB — HEPATIC FUNCTION PANEL
ALBUMIN: 4.5 g/dL (ref 3.5–5.2)
ALK PHOS: 59 U/L (ref 39–117)
ALT: 33 U/L (ref 0–53)
AST: 28 U/L (ref 0–37)
BILIRUBIN DIRECT: 0.1 mg/dL (ref 0.0–0.3)
BILIRUBIN INDIRECT: 0.4 mg/dL (ref 0.2–1.2)
BILIRUBIN TOTAL: 0.5 mg/dL (ref 0.2–1.2)
Total Protein: 6.7 g/dL (ref 6.0–8.3)

## 2013-12-17 LAB — LIPID PANEL
CHOL/HDL RATIO: 3.4 ratio
Cholesterol: 178 mg/dL (ref 0–200)
HDL: 52 mg/dL (ref >39)
LDL Cholesterol: 111 mg/dL — ABNORMAL HIGH (ref 0–99)
Triglycerides: 77 mg/dL (ref <150)
VLDL: 15 mg/dL (ref 0–40)

## 2013-12-17 LAB — MICROALBUMIN / CREATININE URINE RATIO
CREATININE, URINE: 169.8 mg/dL
MICROALB/CREAT RATIO: 2.9 mg/g (ref 0.0–30.0)
Microalb, Ur: 0.5 mg/dL (ref 0.00–1.89)

## 2013-12-17 LAB — MAGNESIUM: Magnesium: 1.9 mg/dL (ref 1.5–2.5)

## 2013-12-17 LAB — TESTOSTERONE: TESTOSTERONE: 417 ng/dL (ref 300–890)

## 2013-12-17 LAB — IRON AND TIBC
%SAT: 26 % (ref 20–55)
IRON: 85 ug/dL (ref 42–165)
TIBC: 327 ug/dL (ref 215–435)
UIBC: 242 ug/dL (ref 125–400)

## 2013-12-17 LAB — INSULIN, FASTING: Insulin fasting, serum: 14 u[IU]/mL (ref 3–28)

## 2013-12-17 LAB — VITAMIN B12: Vitamin B-12: 1303 pg/mL — ABNORMAL HIGH (ref 211–911)

## 2013-12-17 LAB — TSH: TSH: 1.518 u[IU]/mL (ref 0.350–4.500)

## 2013-12-17 LAB — FERRITIN: FERRITIN: 65 ng/mL (ref 22–322)

## 2014-12-27 ENCOUNTER — Encounter: Payer: Self-pay | Admitting: Physician Assistant

## 2015-12-27 ENCOUNTER — Encounter: Payer: Self-pay | Admitting: Physician Assistant

## 2019-09-09 ENCOUNTER — Telehealth: Payer: Self-pay | Admitting: Internal Medicine

## 2019-09-09 NOTE — Telephone Encounter (Signed)
Tried contacting patient to get set up with a new patient appointment.

## 2021-09-27 ENCOUNTER — Encounter (HOSPITAL_BASED_OUTPATIENT_CLINIC_OR_DEPARTMENT_OTHER): Payer: Self-pay

## 2021-09-27 ENCOUNTER — Emergency Department (HOSPITAL_BASED_OUTPATIENT_CLINIC_OR_DEPARTMENT_OTHER): Payer: Managed Care, Other (non HMO)

## 2021-09-27 ENCOUNTER — Emergency Department (HOSPITAL_BASED_OUTPATIENT_CLINIC_OR_DEPARTMENT_OTHER)
Admission: EM | Admit: 2021-09-27 | Discharge: 2021-09-27 | Disposition: A | Discharge status: Home / Self Care | Payer: Managed Care, Other (non HMO) | Attending: Emergency Medicine | Admitting: Emergency Medicine

## 2021-09-27 ENCOUNTER — Other Ambulatory Visit (HOSPITAL_BASED_OUTPATIENT_CLINIC_OR_DEPARTMENT_OTHER): Payer: Self-pay

## 2021-09-27 ENCOUNTER — Other Ambulatory Visit: Payer: Self-pay

## 2021-09-27 DIAGNOSIS — L089 Local infection of the skin and subcutaneous tissue, unspecified: Secondary | ICD-10-CM

## 2021-09-27 DIAGNOSIS — L03011 Cellulitis of right finger: Secondary | ICD-10-CM | POA: Insufficient documentation

## 2021-09-27 LAB — BASIC METABOLIC PANEL
Anion gap: 10 (ref 5–15)
BUN: 19 mg/dL (ref 6–20)
CO2: 24 mmol/L (ref 22–32)
Calcium: 9.2 mg/dL (ref 8.9–10.3)
Chloride: 103 mmol/L (ref 98–111)
Creatinine, Ser: 1.01 mg/dL (ref 0.61–1.24)
GFR, Estimated: >60 mL/min (ref >60)
Glucose, Bld: 116 mg/dL — ABNORMAL HIGH (ref 70–99)
Potassium: 3.7 mmol/L (ref 3.5–5.1)
Sodium: 137 mmol/L (ref 135–145)

## 2021-09-27 LAB — CBC WITH DIFFERENTIAL/PLATELET
Abs Immature Granulocytes: 0.04 10*3/uL (ref 0.00–0.07)
Basophils Absolute: 0 10*3/uL (ref 0.0–0.1)
Basophils Relative: 0 %
Eosinophils Absolute: 0 10*3/uL (ref 0.0–0.5)
Eosinophils Relative: 0 %
HCT: 45.6 % (ref 39.0–52.0)
Hemoglobin: 15.6 g/dL (ref 13.0–17.0)
Immature Granulocytes: 0 %
Lymphocytes Relative: 11 %
Lymphs Abs: 1.5 10*3/uL (ref 0.7–4.0)
MCH: 28.2 pg (ref 26.0–34.0)
MCHC: 34.2 g/dL (ref 30.0–36.0)
MCV: 82.3 fL (ref 80.0–100.0)
Monocytes Absolute: 1 10*3/uL (ref 0.1–1.0)
Monocytes Relative: 8 %
Neutro Abs: 10.8 10*3/uL — ABNORMAL HIGH (ref 1.7–7.7)
Neutrophils Relative %: 81 %
Platelets: 216 10*3/uL (ref 150–400)
RBC: 5.54 MIL/uL (ref 4.22–5.81)
RDW: 13 % (ref 11.5–15.5)
WBC: 13.3 10*3/uL — ABNORMAL HIGH (ref 4.0–10.5)
nRBC: 0 % (ref 0.0–0.2)

## 2021-09-27 MED ORDER — DOXYCYCLINE HYCLATE 100 MG PO CAPS
100.0000 mg | ORAL_CAPSULE | Freq: Two times a day (BID) | ORAL | 0 refills | Status: AC
  Filled 2021-09-27: qty 14, 7d supply, fill #0

## 2021-09-27 MED ORDER — CEFAZOLIN SODIUM-DEXTROSE 2-4 GM/100ML-% IV SOLN
2.0000 g | Freq: Once | INTRAVENOUS | Status: AC
  Administered 2021-09-27: 2 g via INTRAVENOUS
  Filled 2021-09-27: qty 100

## 2021-09-27 NOTE — Discharge Instructions (Signed)
Sinew the antibiotic doxycycline.  As we discussed return for any worse symptoms.  Would expect improvement over the next couple days.  Very importantly return for any symptoms at all. ?

## 2021-09-27 NOTE — ED Provider Notes (Signed)
?[facility] EMERGENCY DEPT ?Provider Note ? ? ?CSN: [id number] ?Arrival date & time: 09/27/21  1016 ? ?  ? ?History ? ?No chief complaint on file. ? ? ?Mitchell Wilkinson is a 36 y.o. male. ? ?Patient with a minor scrape to the dorsal aspect of his right thumb 2 days ago.  Today with some erythema and edema and some increased pain.  Has some red streaking that goes into the wrist.  States his temp at home was on 100.4 here is 98.6.  Past medical history significant for splenomegaly in 2013 but did not have a spleen removed.  History of migraines. ? ?Patient tetanus is up-to-date. ? ? ?  ? ?Home Medications ?Prior to Admission medications   ?Medication Sig Start Date End Date Taking? Authorizing Provider  ?doxycycline (VIBRAMYCIN) 100 MG capsule Take 1 capsule (100 mg total) by mouth 2 (two) times daily. 09/27/21  Yes Vanetta Mulders, MD  ?   ? ?Allergies    ?Patient has no known allergies.   ? ?Review of Systems   ?Review of Systems  ?Constitutional:  Negative for chills and fever.  ?HENT:  Negative for ear pain and sore throat.   ?Eyes:  Negative for pain and visual disturbance.  ?Respiratory:  Negative for cough and shortness of breath.   ?Cardiovascular:  Negative for chest pain and palpitations.  ?Gastrointestinal:  Negative for abdominal pain and vomiting.  ?Genitourinary:  Negative for dysuria and hematuria.  ?Musculoskeletal:  Positive for joint swelling. Negative for arthralgias and back pain.  ?Skin:  Positive for wound. Negative for color change and rash.  ?Neurological:  Negative for seizures and syncope.  ?All other systems reviewed and are negative. ? ?Physical Exam ?Updated Vital Signs ?BP 138/88   Pulse 80   Temp 98.6 ?F (37 ?C) (Oral)   Resp 16   SpO2 100%  ?Physical Exam ?Vitals and nursing note reviewed.  ?Constitutional:   ?   General: He is not in acute distress. ?   Appearance: Normal appearance. He is well-developed.  ?HENT:  ?   Head: Normocephalic and atraumatic.  ?Eyes:  ?    Conjunctiva/sclera: Conjunctivae normal.  ?   Pupils: Pupils are equal, round, and reactive to light.  ?Cardiovascular:  ?   Rate and Rhythm: Normal rate and regular rhythm.  ?   Heart sounds: No murmur heard. ?Pulmonary:  ?   Effort: Pulmonary effort is normal. No respiratory distress.  ?   Breath sounds: Normal breath sounds.  ?Abdominal:  ?   Palpations: Abdomen is soft.  ?   Tenderness: There is no abdominal tenderness.  ?Musculoskeletal:     ?   General: No swelling.  ?   Cervical back: Normal range of motion and neck supple.  ?   Comments: Patient's right thumb with some swelling not distally.  Has some abrasions at the PIP joint.  And some swelling to the proximal part of the thumb with some red streaking going towards the wrist.  Pretty good range of motion.  No significant pain with range of motion no pain with passive range of motion.  Radial pulses 2+.  Good cap refill to the distal tip of thumb.  Other fingers are all normal.  ?Skin: ?   General: Skin is warm and dry.  ?   Capillary Refill: Capillary refill takes less than 2 seconds.  ?Neurological:  ?   General: No focal deficit present.  ?   Mental Status: He is alert and oriented to person,  place, and time.  ?   Cranial Nerves: No cranial nerve deficit.  ?   Sensory: No sensory deficit.  ?   Motor: No weakness.  ?Psychiatric:     ?   Mood and Affect: Mood normal.  ? ? ?ED Results / Procedures / Treatments   ?Labs ?(all labs ordered are listed, but only abnormal results are displayed) ?Labs Reviewed  ?CBC WITH DIFFERENTIAL/PLATELET - Abnormal; Notable for the following components:  ?    Result Value  ? WBC 13.3 (*)   ? Neutro Abs 10.8 (*)   ? All other components within normal limits  ?BASIC METABOLIC PANEL - Abnormal; Notable for the following components:  ? Glucose, Bld 116 (*)   ? All other components within normal limits  ? ? ?EKG ?None ? ?Radiology ?DG Finger Thumb Right ? ?Result Date: 09/27/2021 ?CLINICAL DATA:  36 year old male with right thumb  infection, assess for foreign body. EXAM: RIGHT THUMB 2+V COMPARISON:  None. FINDINGS: There is no evidence of fracture or dislocation. There is no evidence of arthropathy or other focal bone abnormality. Edematous changes about the subcutaneous tissues of the right thumb, most prominent about that proximal interphalangeal region. No radiopaque foreign bodies. IMPRESSION: No radiopaque foreign bodies. Soft tissue edema about the right thumb, most prominent about the proximal interphalangeal region. Electronically Signed   By: Marliss Coots M.D.   On: 09/27/2021 11:39   ? ?Procedures ?Procedures  ? ? ?Medications Ordered in ED ?Medications  ?ceFAZolin (ANCEF) IVPB 2g/100 mL premix (0 g Intravenous Stopped 09/27/21 1225)  ? ? ?ED Course/ Medical Decision Making/ A&P ?  ?                        ?Medical Decision Making ?Amount and/or Complexity of Data Reviewed ?Labs: ordered. ?Radiology: ordered. ? ?Risk ?Prescription drug management. ? ?Patient clearly with an infection to the thumb.  No evidence of any area of fluctuance that could be I&D.  Do not have a concern for tenosynovitis patient has good range of motion particularly at the PIP joint of the thumb.  Tetanus is up-to-date.  We will give a dose of Ancef here and will continue him on doxycycline at home.  If he does not continue to improve patient will return.  Patient already has improvement just from the IV dose of Ancef.  Not feel that patient needs referral to hand surgeon at this time.  There does not appear to be anything for I&D. ? ?X-ray of the right thumb without evidence of any foreign body or any bony abnormality. ? ? ?Final Clinical Impression(s) / ED Diagnoses ?Final diagnoses:  ?Finger infection  ? ? ?Rx / DC Orders ?ED Discharge Orders   ? ?      Ordered  ?  doxycycline (VIBRAMYCIN) 100 MG capsule  2 times daily       ? 09/27/21 1255  ? ?  ?  ? ?  ? ? ?  ?Vanetta Mulders, MD ?09/27/21 1256 ? ?

## 2021-09-27 NOTE — ED Triage Notes (Signed)
He reports getting a minor "scrape" on post. Aspect of right thumb distal phalanx 2 days ago. He is here today with c/o erythema and edema of entire post. Right thumb extending to distal wrist. He is afebrile now and he tells me that his temperature at home this morning was 100.4. ?

## 2023-10-25 IMAGING — DX DG FINGER THUMB 2+V*R*
3 series · 3 of 3 positions shown · non-contrast
Comparison: None.

CLINICAL DATA: 35-year-old male with right thumb infection, assess
for foreign body.

EXAM:
RIGHT THUMB 2+V

[finger ap]
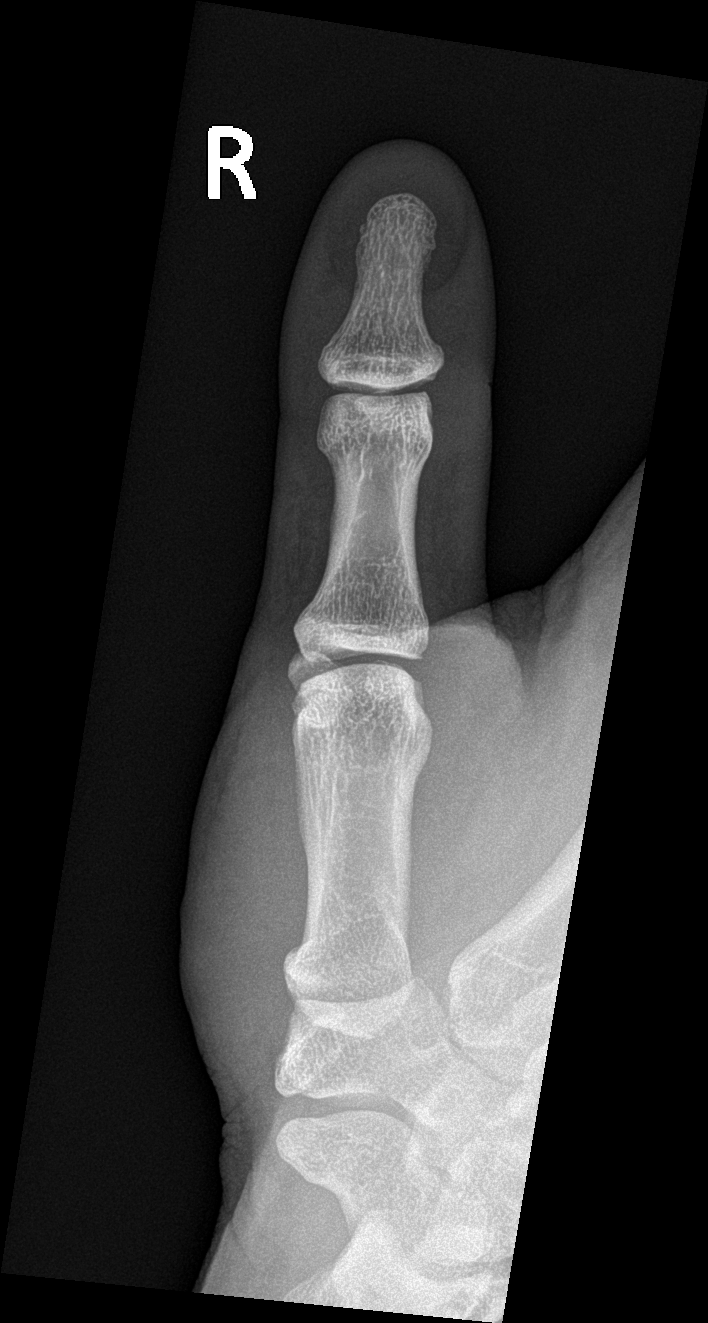

[finger obl]
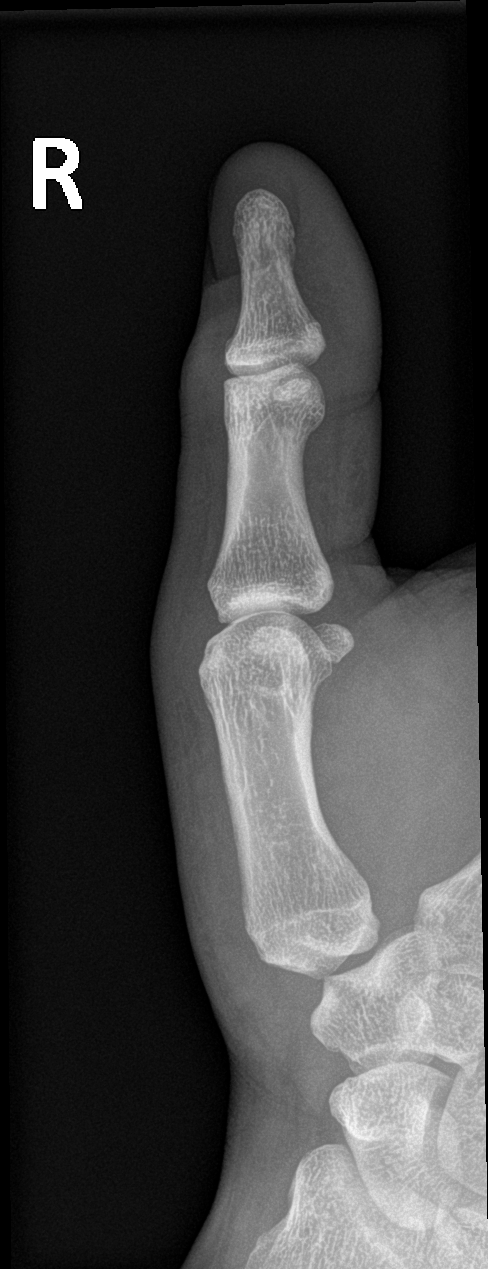

[finger lat]
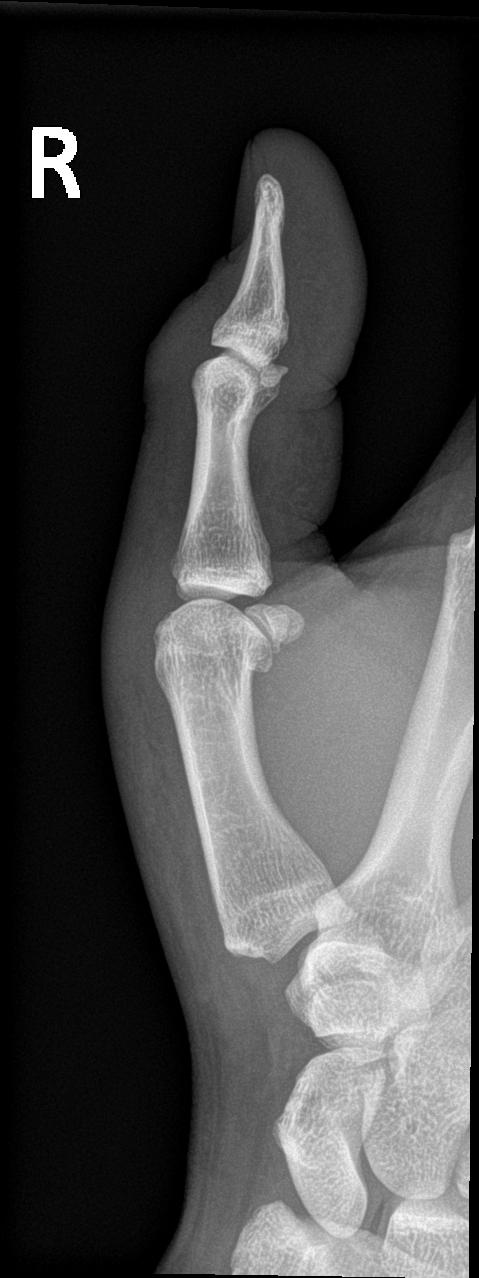

[3 of 3 positions shown; findings below may reference images not displayed]

FINDINGS: There is no evidence of fracture or dislocation. There is no
evidence of arthropathy or other focal bone abnormality. Edematous
changes about the subcutaneous tissues of the right thumb, most
prominent about that proximal interphalangeal region. No radiopaque
foreign bodies.
IMPRESSION: No radiopaque foreign bodies. Soft tissue edema about the right
thumb, most prominent about the proximal interphalangeal region.

## 2023-12-11 ENCOUNTER — Emergency Department (HOSPITAL_COMMUNITY)

## 2023-12-11 ENCOUNTER — Encounter (HOSPITAL_COMMUNITY): Payer: Self-pay | Admitting: Emergency Medicine

## 2023-12-11 ENCOUNTER — Other Ambulatory Visit: Payer: Self-pay

## 2023-12-11 ENCOUNTER — Emergency Department (HOSPITAL_COMMUNITY)
Admission: EM | Admit: 2023-12-11 | Discharge: 2023-12-11 | Disposition: A | Discharge status: Home / Self Care | Attending: Emergency Medicine | Admitting: Emergency Medicine

## 2023-12-11 DIAGNOSIS — W268XXA Contact with other sharp object(s), not elsewhere classified, initial encounter: Secondary | ICD-10-CM | POA: Insufficient documentation

## 2023-12-11 DIAGNOSIS — Z23 Encounter for immunization: Secondary | ICD-10-CM | POA: Diagnosis not present

## 2023-12-11 DIAGNOSIS — S81811A Laceration without foreign body, right lower leg, initial encounter: Secondary | ICD-10-CM | POA: Diagnosis present

## 2023-12-11 DIAGNOSIS — Y990 Civilian activity done for income or pay: Secondary | ICD-10-CM | POA: Insufficient documentation

## 2023-12-11 MED ORDER — LIDOCAINE-EPINEPHRINE (PF) 2 %-1:200000 IJ SOLN
10.0000 mL | Freq: Once | INTRAMUSCULAR | Status: AC
  Administered 2023-12-11: 10 mL
  Filled 2023-12-11: qty 20

## 2023-12-11 MED ORDER — FENTANYL CITRATE PF 50 MCG/ML IJ SOSY
50.0000 ug | PREFILLED_SYRINGE | Freq: Once | INTRAMUSCULAR | Status: AC
  Administered 2023-12-11: 50 ug via INTRAVENOUS
  Filled 2023-12-11: qty 1

## 2023-12-11 MED ORDER — TETANUS-DIPHTH-ACELL PERTUSSIS 5-2.5-18.5 LF-MCG/0.5 IM SUSY
0.5000 mL | PREFILLED_SYRINGE | Freq: Once | INTRAMUSCULAR | Status: AC
  Administered 2023-12-11: 0.5 mL via INTRAMUSCULAR
  Filled 2023-12-11: qty 0.5

## 2023-12-11 NOTE — ED Provider Notes (Incomplete)
 I provided a substantive portion of the care of this patient.  I personally made/approved the management plan for this patient and take responsibility for the patient management. {Remember to document shared critical care using "edcritical" dot phrase:1}

## 2023-12-11 NOTE — ED Triage Notes (Signed)
 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  3-4 inches long and about 2 inches deep.  Bleeding controlled at this time.  Pt did not fall and did not hit head. VS BP 144/78, HR 72, SpO2 96% RA

## 2023-12-11 NOTE — Progress Notes (Signed)
 Orthopedic Tech Progress Note Patient Details:  Mitchell Wilkinson Dec 27, 1985 [id number]  Ortho Devices Type of Ortho Device: Crutches Ortho Device/Splint Location: RLE Ortho Device/Splint Interventions: Ordered, Adjustment   Post Interventions Patient Tolerated: Well Instructions Provided: Care of device   Mitchell Wilkinson Mitchell Wilkinson 12/11/2023, 8:53 PM

## 2023-12-11 NOTE — Discharge Instructions (Addendum)
 It was a pleasure to take care of you today.  Today we evaluated your right lower leg laceration.  Your x-rays today were negative for fracture or foreign body.  Today your laceration was repaired using sutures.  The superficial sutures will need to come out in 7 to 10 days, this can be done in the emergency department, orthopedics, urgent care, or possibly by your primary care provider.  Please follow-up with orthopedics as soon as possible for ongoing diagnosis and treatment.  Their number is attached, please call to make an appointment.  Please return the emergency department or seek further medical care if you experience any of the following symptoms including but not limited to fever, chills, severe pain, any sign of infection including drainage, redness surrounding the wound, or if the wound opens back up.  Please follow-up with your primary care and specialist as scheduled.  Please do not bear weight on the leg until the orthopedic follow-up, you have been crutches given today.  Please keep the wound clean and dry and covered.

## 2023-12-11 NOTE — ED Provider Notes (Signed)
 [place] EMERGENCY DEPARTMENT AT [facility] Provider Note   CSN: [id number] Arrival date & time: 12/11/23  1442     Patient presents with: No chief complaint on file.   Mitchell Wilkinson is a 38 y.o. male who presents to the emergency department with a chief complaint of right leg laceration.  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  EMS was called, bleeding was stopped using compression.  Patient states that he is due for a tetanus shot.  Denies fever, chills, drowsiness. Patient has not been ambulatory since injury.  Patient denies blood thinner, patient denies head injury.  Patient denies significant past medical history and states he takes no prescription medications at home.   HPI     Prior to Admission medications   Medication Sig Start Date End Date Taking? Authorizing Provider  doxycycline  (VIBRAMYCIN ) 100 MG capsule Take 1 capsule (100 mg total) by mouth 2 (two) times daily. 09/27/21   Zackowski, Scott, MD    Allergies: Patient has no known allergies.    Review of Systems  Skin:  Positive for wound.    Updated Vital Signs BP 135/87 (BP Location: Right Arm)   Pulse 81   Temp 98 F (36.7 C) (Oral)   Resp 16   Ht 6' 3 (1.905 m)   Wt 124.7 kg   SpO2 100%   BMI 34.37 kg/m   Physical Exam Vitals and nursing note reviewed.  Constitutional:      General: He is awake. He is not in acute distress.    Appearance: Normal appearance. He is not ill-appearing, toxic-appearing or diaphoretic.  HENT:     Head: Normocephalic and atraumatic.   Cardiovascular:     Rate and Rhythm: Normal rate and regular rhythm.  Pulmonary:     Effort: Pulmonary effort is normal. No respiratory distress.     Breath sounds: Normal breath sounds. No wheezing, rhonchi or rales.   Musculoskeletal:        General: Signs of injury (Large laceration present to posterior R calf, incision is down through skin and  subcutaneous fat, ligament/tendon is visualized, bleeding controlled) present.     Cervical back: Normal range of motion.     Right lower leg: No edema.     Left lower leg: No edema.  Feet:     Comments: DP pulse on RLE confirmed with doppler, comparable to Left  RLE neurovascularly intact, cap refill <2, sensation intact   Skin:    General: Skin is warm and dry.     Capillary Refill: Capillary refill takes less than 2 seconds.     Comments: Abrasion present to front of R anterior shin   Neurological:     General: No focal deficit present.     Mental Status: He is alert and oriented to person, place, and time.     Sensory: No sensory deficit.     Motor: No weakness.     Coordination: Coordination normal.   Psychiatric:        Mood and Affect: Mood normal.        Behavior: Behavior normal. Behavior is cooperative.      (all labs ordered are listed, but only abnormal results are displayed) Labs Reviewed - No data to display  EKG: None  Radiology: DG Tibia/Fibula Right Result Date: 12/11/2023 CLINICAL DATA:  Laceration, evaluate for foreign body. EXAM: RIGHT TIBIA AND FIBULA - 2 VIEW  COMPARISON:  None Available. FINDINGS: There is a large soft tissue laceration of the posteromedial mid lower extremity. There is also small amount of air or laceration just superior to the medial ankle without foreign body. There is no foreign body in this region. There is no acute fracture or dislocation. Small punctate soft tissue calcification is seen anterior to the mid tibia, nonspecific. IMPRESSION: 1. Large soft tissue laceration of the posteromedial mid lower extremity. No foreign body. 2. Small amount of air or laceration just superior to the medial ankle without foreign body. Electronically Signed   By: Greig Pique M.D.   On: 12/11/2023 16:37     .Laceration Repair  Date/Time: 12/11/2023 9:04 PM  Performed by: Janetta Terrall FALCON, PA-C Authorized by: Janetta Terrall FALCON, PA-C   Consent:     Consent obtained:  Verbal   Consent given by:  Patient   Risks, benefits, and alternatives were discussed: yes     Risks discussed:  Infection, pain, retained foreign body, need for additional repair, poor cosmetic result, tendon damage and poor wound healing Universal protocol:    Patient identity confirmed:  Verbally with patient and arm band Anesthesia:    Anesthesia method:  Local infiltration   Local anesthetic:  Lidocaine 2% WITH epi Laceration details:    Location:  Leg   Leg location:  R lower leg   Length (cm):  13 Exploration:    Limited defect created (wound extended): yes     Hemostasis achieved with:  Direct pressure   Imaging obtained: x-ray     Imaging outcome: foreign body not noted   Treatment:    Area cleansed with:  Saline and Shur-Clens   Amount of cleaning:  Standard   Visualized foreign bodies/material removed: no     Layers/structures repaired:  Deep subcutaneous Deep subcutaneous:    Suture size:  4-0   Suture material:  Vicryl   Suture technique:  Horizontal mattress   Number of sutures:  4 Skin repair:    Repair method:  Sutures Approximation:    Approximation:  Close Repair type:    Repair type:  Complex (Extensive laceration down to level of muscle/tendon, 4 internal horizontal mattress absorbable sutures placed, 22 superficial skin sutures) Post-procedure details:    Dressing:  Non-adherent dressing   Procedure completion:  Tolerated well, no immediate complications    Medications Ordered in the ED  Tdap (BOOSTRIX) injection 0.5 mL (0.5 mLs Intramuscular Given 12/11/23 1537)  fentaNYL (SUBLIMAZE) injection 50 mcg (50 mcg Intravenous Given 12/11/23 1538)  lidocaine-EPINEPHrine (XYLOCAINE W/EPI) 2 %-1:200000 (PF) injection 10 mL (10 mLs Infiltration Given 12/11/23 1744)  lidocaine-EPINEPHrine (XYLOCAINE W/EPI) 2 %-1:200000 (PF) injection 10 mL (10 mLs Infiltration Given 12/11/23 1829)                                    Medical Decision  Making Amount and/or Complexity of Data Reviewed Radiology: ordered.  Risk Prescription drug management.   Patient presents to the ED for concern of right leg laceration, this involves an extensive number of treatment options, and is a complaint that carries with it a high risk of complications and morbidity.  The differential diagnosis includes laceration, foreign body, vascular injury, fracture, etc.   Co morbidities that complicate the patient evaluation  None   Imaging Studies ordered:  I ordered imaging studies including x-ray of right tibia/fibula I independently visualized and interpreted imaging which showed  no foreign body, no fracture I agree with the radiologist interpretation   Medicines ordered and prescription drug management:  I ordered medication including fentanyl for pain Reevaluation of the patient after these medicines showed that the patient improved I have reviewed the patients home medicines and have made adjustments as needed   Test Considered:  None    Critical Interventions:  none   Problem List / ED Course:  38 year old male, workplace injury, severe laceration to back of right calf, through skin, subcutaneous fat, ligament/tendon visualized Muscle and tendon visualized with full ROM of foot, no obvious muscle or tendon defect, plantar flexion and dorsiflexion of foot intact  Fentanyl given for pain, tetanus ordered, x-ray of right tib-fib ordered Distal DP pulse of right foot confirmed with Doppler, 2+, comparable to left, no weakness of RLE Cap refill of right lower extremity as appropriate Xray negative for foreign body, no fracture Laceration repair completed using absorbable and non-absorbable sutures, limited wound defect created  Non-adhesive bandage placed  Crutches ordered, ortho follow-up  Return precautions and instructions about keeping wound clean given, patient instructed to remain non-weight bearing until ortho follow-up   Patient discharged  Return precautions given for suture removal in 7-10 days, wound care instructions given.    Reevaluation:  After the interventions noted above, I reevaluated the patient and found that they have :improved   Social Determinants of Health:  none   Dispostion:  After consideration of the diagnostic results and the patients response to treatment, I feel that the patent would benefit from discharge and orthopedic follow-up as instructed..       Final diagnoses:  Laceration of right lower leg, initial encounter    ED Discharge Orders     None          Janetta Terrall JULIANNA DEVONNA 12/11/23 2354    Randol Simmonds, MD 12/12/23 1601

## 2023-12-21 ENCOUNTER — Emergency Department (HOSPITAL_BASED_OUTPATIENT_CLINIC_OR_DEPARTMENT_OTHER)
Admission: EM | Admit: 2023-12-21 | Discharge: 2023-12-21 | Disposition: A | Discharge status: Home / Self Care | Attending: Emergency Medicine | Admitting: Emergency Medicine

## 2023-12-21 ENCOUNTER — Encounter (HOSPITAL_BASED_OUTPATIENT_CLINIC_OR_DEPARTMENT_OTHER): Payer: Self-pay | Admitting: Emergency Medicine

## 2023-12-21 ENCOUNTER — Other Ambulatory Visit: Payer: Self-pay

## 2023-12-21 DIAGNOSIS — T8130XA Disruption of wound, unspecified, initial encounter: Secondary | ICD-10-CM | POA: Diagnosis not present

## 2023-12-21 DIAGNOSIS — Z4800 Encounter for change or removal of nonsurgical wound dressing: Secondary | ICD-10-CM | POA: Diagnosis present

## 2023-12-21 DIAGNOSIS — Z4802 Encounter for removal of sutures: Secondary | ICD-10-CM

## 2023-12-21 MED ORDER — CEPHALEXIN 250 MG PO CAPS
1000.0000 mg | ORAL_CAPSULE | Freq: Once | ORAL | Status: AC
Start: 2023-12-21 — End: 2023-12-21
  Administered 2023-12-21: 1000 mg via ORAL
  Filled 2023-12-21: qty 4

## 2023-12-21 MED ORDER — CEPHALEXIN 500 MG PO CAPS: 1000.0000 mg | ORAL_CAPSULE | Freq: Two times a day (BID) | ORAL | 0 refills | Status: AC

## 2023-12-21 NOTE — ED Triage Notes (Signed)
Here for stitch removal

## 2023-12-21 NOTE — Discharge Instructions (Signed)
 Three sutures were left in place and should stay another 4-5 days. Take Keflex  to prevent progression to infection. Continue to clean the wound twice daily - keep covered when exposed to unclean environments, otherwise, can leave to open air.   Return to the ED with any concern for worsening symptoms. Otherwise, return in 4-5 days to have the remaining sutures removed.

## 2023-12-21 NOTE — ED Notes (Signed)
 Leg was bandaged

## 2023-12-21 NOTE — ED Provider Notes (Signed)
  [place] EMERGENCY DEPARTMENT AT [facility] Provider Note   CSN: [id number] Arrival date & time: 12/21/23  1719     Patient presents with: No chief complaint on file.   Mitchell Wilkinson is a 38 y.o. male.   Patient to ED to have stitches to the right posterior calf removed. They were placed 10 days ago after his leg was caught by a wooden pallet with being moved. He reports a small area of bleeding and drainage but no redness or pain.         Prior to Admission medications   Medication Sig Start Date End Date Taking? Authorizing Provider  doxycycline  (VIBRAMYCIN ) 100 MG capsule Take 1 capsule (100 mg total) by mouth 2 (two) times daily. 09/27/21   Zackowski, Scott, MD    Allergies: Patient has no known allergies.    Review of Systems  Updated Vital Signs BP (!) 146/102 (BP Location: Right Arm)   Pulse 82   Temp 98.3 F (36.8 C)   Resp 17   SpO2 100%   Physical Exam Vitals and nursing note reviewed.  Skin:    Comments: Healing stellate wound to posterior right calf. There is an area measuring 2 cm of bloody serosanguinous drainage. No pus. No surrounding redness. Minimally tender. Remainder of wound appears well healed and appropriate for stitch removal.      (all labs ordered are listed, but only abnormal results are displayed) Labs Reviewed - No data to display  EKG: None  Radiology: No results found.   Procedures   Medications Ordered in the ED  cephALEXin  (KEFLEX ) capsule 1,000 mg (has no administration in time range)    Clinical Course as of 12/21/23 2153  Sat Dec 21, 2023  2148 Stitches removed easily from majority of right calf wound. Good would closure/healing. 3 sutures left in place along the area that is draining as there was incomplete healing. No pus. Will cover with 5 days of Keflex  out of abundance of caution for nonhealing area. Sutures to remain in place for an additional 4-5 days. [SU]    Clinical Course User Index [SU] Odell Balls, PA-C                                 Medical Decision Making       Final diagnoses:  Visit for suture removal  Wound dehiscence    ED Discharge Orders     None          Odell Balls RIGGERS 12/21/23 2153    Patsey Lot, MD 12/21/23 2310
